# Patient Record
Sex: Male | Born: 1995 | Race: Black or African American | Hispanic: No | Marital: Single | Smoking: Never smoker
Health system: Southern US, Community
[De-identification: ages and names within clinical notes are randomized; demographics above are authoritative.]

## PROBLEM LIST (undated history)

## (undated) DIAGNOSIS — J45909 Unspecified asthma, uncomplicated: Secondary | ICD-10-CM

## (undated) HISTORY — DX: Unspecified asthma, uncomplicated: J45.909

---
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
## 2018-07-26 ENCOUNTER — Ambulatory Visit: Payer: BC Managed Care – PPO

## 2018-07-26 ENCOUNTER — Other Ambulatory Visit: Payer: Self-pay

## 2018-07-26 ENCOUNTER — Encounter: Payer: Self-pay | Admitting: Family Medicine

## 2018-07-26 ENCOUNTER — Ambulatory Visit (INDEPENDENT_AMBULATORY_CARE_PROVIDER_SITE_OTHER): Payer: BC Managed Care – PPO | Admitting: Family Medicine

## 2018-07-26 ENCOUNTER — Ambulatory Visit (INDEPENDENT_AMBULATORY_CARE_PROVIDER_SITE_OTHER): Payer: BC Managed Care – PPO

## 2018-07-26 VITALS — BP 116/69 | HR 56 | Temp 98.5°F | Resp 20 | Ht 68.0 in | Wt 204.0 lb

## 2018-07-26 DIAGNOSIS — R0789 Other chest pain: Secondary | ICD-10-CM | POA: Diagnosis not present

## 2018-07-26 DIAGNOSIS — R058 Other specified cough: Secondary | ICD-10-CM

## 2018-07-26 DIAGNOSIS — R05 Cough: Secondary | ICD-10-CM | POA: Diagnosis not present

## 2018-07-26 DIAGNOSIS — J208 Acute bronchitis due to other specified organisms: Secondary | ICD-10-CM | POA: Diagnosis not present

## 2018-07-26 DIAGNOSIS — R062 Wheezing: Secondary | ICD-10-CM

## 2018-07-26 MED ORDER — PREDNISONE 20 MG PO TABS: 40.0000 mg | ORAL_TABLET | Freq: Every day | ORAL | 0 refills | Status: AC

## 2018-07-26 MED ORDER — ALBUTEROL SULFATE HFA 108 (90 BASE) MCG/ACT IN AERS: 2.0000 | INHALATION_SPRAY | RESPIRATORY_TRACT | 1 refills | Status: DC | PRN

## 2018-07-26 MED ORDER — AMOXICILLIN-POT CLAVULANATE 875-125 MG PO TABS: 1.0000 | ORAL_TABLET | Freq: Two times a day (BID) | ORAL | 0 refills | Status: DC

## 2018-07-26 MED ORDER — BENZONATATE 100 MG PO CAPS: 100.0000 mg | ORAL_CAPSULE | Freq: Three times a day (TID) | ORAL | 0 refills | Status: DC | PRN

## 2018-07-26 NOTE — Patient Instructions (Signed)
You are being treated for bronchitis today.  I will start you on an antibiotic which is Augmentin you will take 1 tablet twice daily for a total of 10 days.  I am also prescribing you prednisone this is to open up your lungs and decrease the inflammation.  You will take 2 tablets daily with breakfast for only 5 days.  I prescribed you benzonatate you may take 1 to 2 tablets 3 times daily as needed for cough.  I have also prescribed you an albuterol inhaler you can take 2 puffs every 6 hours as needed for persistent cough and or shortness of breath and/or wheezing.  If symptoms have not improved within 5 to 7 days please follow-up by phone for further evaluation.    Acute Bronchitis, Adult Acute bronchitis is when air tubes (bronchi) in the lungs suddenly get swollen. The condition can make it hard to breathe. It can also cause these symptoms:  A cough.  Coughing up clear, yellow, or green mucus.  Wheezing.  Chest congestion.  Shortness of breath.  A fever.  Body aches.  Chills.  A sore throat. Follow these instructions at home:  Medicines  Take over-the-counter and prescription medicines only as told by your doctor.  If you were prescribed an antibiotic medicine, take it as told by your doctor. Do not stop taking the antibiotic even if you start to feel better. General instructions  Rest.  Drink enough fluids to keep your pee (urine) pale yellow.  Avoid smoking and secondhand smoke. If you smoke and you need help quitting, ask your doctor. Quitting will help your lungs heal faster.  Use an inhaler, cool mist vaporizer, or humidifier as told by your doctor.  Keep all follow-up visits as told by your doctor. This is important. How is this prevented? To lower your risk of getting this condition again:  Wash your hands often with soap and water. If you cannot use soap and water, use hand sanitizer.  Avoid contact with people who have cold symptoms.  Try not to touch  your hands to your mouth, nose, or eyes.  Make sure to get the flu shot every year. Contact a doctor if:  Your symptoms do not get better in 2 weeks. Get help right away if:  You cough up blood.  You have chest pain.  You have very bad shortness of breath.  You become dehydrated.  You faint (pass out) or keep feeling like you are going to pass out.  You keep throwing up (vomiting).  You have a very bad headache.  Your fever or chills gets worse. This information is not intended to replace advice given to you by your health care provider. Make sure you discuss any questions you have with your health care provider. Document Released: 10/12/2007 Document Revised: 12/07/2016 Document Reviewed: 10/14/2015 Elsevier Interactive Patient Education  2019 [employer].

## 2018-07-26 NOTE — Progress Notes (Deleted)
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

## 2018-07-26 NOTE — Progress Notes (Signed)
Blake Robles, is a 23 y.o. male  [id number]:[id number]  [id number]  DOB - [birth year]  CC: No chief complaint on file.      HPI: Blake Robles is a 23 y.o. male is here today to establish care and evaluation of chest congestion. Medical history significant for asthma and chronic seasonal allergies. Patient presents today, accompanied by his father, with a complaint of productive cough (green mucus), mild shortness of breath with activity. He is uncertain of fever as he has not measured temperature. He has a history of asthma, but denies wheezing. He has taken over the counter medication which has mildly improved cough. He recently returned home from college at [employer]. Symptoms have remained been present for 5-7 days.   Current medications: Current Outpatient Medications:  .  albuterol (PROVENTIL HFA;VENTOLIN HFA) 108 (90 Base) MCG/ACT inhaler, Inhale 2 puffs into the lungs every 4 (four) hours as needed for wheezing or shortness of breath (cough, shortness of breath or wheezing.)., Disp: 1 Inhaler, Rfl: 1 .  amoxicillin-clavulanate (AUGMENTIN) 875-125 MG tablet, Take 1 tablet by mouth 2 (two) times daily., Disp: 20 tablet, Rfl: 0 .  benzonatate (TESSALON) 100 MG capsule, Take 1-2 capsules (100-200 mg total) by mouth 3 (three) times daily as needed for cough., Disp: 40 capsule, Rfl: 0 .  predniSONE (DELTASONE) 20 MG tablet, Take 2 tablets (40 mg total) by mouth daily with breakfast for 5 days., Disp: 10 tablet, Rfl: 0   Pertinent family medical history: family history is not on file.   Not on File  Social History   Socioeconomic History  . Marital status: Single    Spouse name: Not on file  . Number of children: Not on file  . Years of education: Not on file  . Highest education level: Not on file  Occupational History  . Not on file  Social Needs  . Financial resource strain: Not on file  . Food insecurity:    Worry: Not on file    Inability: Not on file  . Transportation needs:   Medical: Not on file    Non-medical: Not on file  Tobacco Use  . Smoking status: Not on file  Substance and Sexual Activity  . Alcohol use: Not on file  . Drug use: Not on file  . Sexual activity: Not on file  Lifestyle  . Physical activity:    Days per week: Not on file    Minutes per session: Not on file  . Stress: Not on file  Relationships  . Social connections:    Talks on phone: Not on file    Gets together: Not on file    Attends religious service: Not on file    Active member of club or organization: Not on file    Attends meetings of clubs or organizations: Not on file    Relationship status: Not on file  . Intimate partner violence:    Fear of current or ex partner: Not on file    Emotionally abused: Not on file    Physically abused: Not on file    Forced sexual activity: Not on file  Other Topics Concern  . Not on file  Social History Narrative  . Not on file    Review of Systems: Pertinent negatives listed in HPI Objective:   Vitals:   07/26/18 1117  BP: 116/69  Pulse: (!) 56  Resp: 20  Temp: 98.5 F (36.9 C)  SpO2: 96%    BP Readings from Last 3 Encounters:  07/26/18  116/69    Filed Weights   07/26/18 1117  Weight: 204 lb (92.5 kg)      Physical Exam: General appearance: alert, well developed, well nourished, cooperative and in no distress Head: Normocephalic, without obvious abnormality, atraumatic Respiratory: Respirations even and unlabored, normal respiratory rate Heart: rate and rhythm normal. No gallop or murmurs noted on exam  Abdomen: BS +, no distention, no rebound tenderness, or no mass Extremities: No gross deformities Skin: Skin color, texture, turgor normal. No rashes seen  Psych: Appropriate mood and affect. Neurologic: Mental status: Alert, oriented to person, place, and time, thought content appropriate.     Assessment and plan:  1. Productive cough - DG Chest 2 View; Future 2. Acute bronchitis due to other specified  organisms -Start Amoxicillin 875 twice daily   -Start benzonatate 100-200 mg up to 3 times daily -Start prednisone 40 mg x 5 days -Start albuterol inhaler 2 puffs twice daily as needed   Dg Chest 2 View  Result Date: 07/26/2018 CLINICAL DATA:  Productive cough with wheezing for the past 7 days. Nasal and chest congestion. Recent travel. EXAM: CHEST - 2 VIEW COMPARISON:  None. FINDINGS: Normal sized heart. Clear lungs. Mild-to-moderate peribronchial thickening. Unremarkable bones. IMPRESSION: Mild to moderate bronchitic changes. Electronically Signed   By: Beckie Salts M.D.   On: 07/26/2018 12:24    Return for follow-up if symptoms worsen or do improve.  The patient was given clear instructions to go to ER or return to medical center if symptoms don't improve, worsen or new problems develop. The patient verbalized understanding. The patient was advised  to call and obtain lab results if they haven't heard anything from out office within 7-10 business days.  Joaquin Courts, FNP Primary Care at [facility] [address], [place] [address] [phone]: [phone]    This note has been created with Dragon speech recognition software and Paediatric nurse. Any transcriptional errors are unintentional.

## 2018-07-26 NOTE — Progress Notes (Deleted)
Blake Robles, is a 23 y.o. male  VOZ:[id number]  [id number]  DOB - Jul 26, 1995  CC: No chief complaint on file.      HPI: Blake Robles is a 23 y.o. male is here today to establish care.   Blake Robles. does not have a problem list on file.    Today's visit:    Patient denies new headaches, chest pain, abdominal pain, nausea, new weakness , numbness or tingling, SOB, edema, or worrisome cough. .    Current medications:No current outpatient medications on file.   Pertinent family medical history: family history is not on file.   Not on File  Social History   Socioeconomic History  . Marital status: Single    Spouse name: Not on file  . Number of children: Not on file  . Years of education: Not on file  . Highest education level: Not on file  Occupational History  . Not on file  Social Needs  . Financial resource strain: Not on file  . Food insecurity:    Worry: Not on file    Inability: Not on file  . Transportation needs:    Medical: Not on file    Non-medical: Not on file  Tobacco Use  . Smoking status: Not on file  Substance and Sexual Activity  . Alcohol use: Not on file  . Drug use: Not on file  . Sexual activity: Not on file  Lifestyle  . Physical activity:    Days per week: Not on file    Minutes per session: Not on file  . Stress: Not on file  Relationships  . Social connections:    Talks on phone: Not on file    Gets together: Not on file    Attends religious service: Not on file    Active member of club or organization: Not on file    Attends meetings of clubs or organizations: Not on file    Relationship status: Not on file  . Intimate partner violence:    Fear of current or ex partner: Not on file    Emotionally abused: Not on file    Physically abused: Not on file    Forced sexual activity: Not on file  Other Topics Concern  . Not on file  Social History Narrative  . Not on file    Review of Systems: Constitutional: Negative for  fever, chills, diaphoresis, activity change, appetite change and fatigue. HENT: Negative for ear pain, nosebleeds, congestion, facial swelling, rhinorrhea, neck pain, neck stiffness and ear discharge.  Eyes: Negative for pain, discharge, redness, itching and visual disturbance. Respiratory: Negative for cough, choking, chest tightness, shortness of breath, wheezing and stridor.  Cardiovascular: Negative for chest pain, palpitations and leg swelling. Gastrointestinal: Negative for abdominal distention. Genitourinary: Negative for dysuria, urgency, frequency, hematuria, flank pain, decreased urine volume, difficulty urinating. Musculoskeletal: Negative for back pain, joint swelling, arthralgia and gait problem. Neurological: Negative for dizziness, tremors, seizures, syncope, facial asymmetry, speech difficulty, weakness, light-headedness, numbness and headaches.  Hematological: Negative for adenopathy. Does not bruise/bleed easily. Psychiatric/Behavioral: Negative for hallucinations, behavioral problems, confusion, dysphoric mood, decreased concentration and agitation.    Objective:  There were no vitals filed for this visit.  BP Readings from Last 3 Encounters:  No data found for BP    There were no vitals filed for this visit.    Physical Exam: Constitutional: Patient appears well-developed and well-nourished. No distress. HENT: Normocephalic, atraumatic, External right and left ear normal. Oropharynx is clear and  moist.  Eyes: Conjunctivae and EOM are normal. PERRLA, no scleral icterus. Neck: Normal ROM. Neck supple. No JVD. No tracheal deviation. No thyromegaly. CVS: RRR, S1/S2 +, no murmurs, no gallops, no carotid bruit.  Pulmonary: Effort and breath sounds normal, no stridor, rhonchi, wheezes, rales.  Abdominal: Soft. BS +, no distension, tenderness, rebound or guarding.  Musculoskeletal: Normal range of motion. No edema and no tenderness.  Neuro: Alert. Normal muscle tone  coordination. Normal gait. BUE and BLE strength 5/5. Bilateral hand grips symmetrical. No cranial nerve deficit. Skin: Skin is warm and dry. No rash noted. Not diaphoretic. No erythema. No pallor. Psychiatric: Normal mood and affect. Behavior, judgment, thought content normal.  Lab Results (prior encounters)  No results found for: WBC, HGB, HCT, MCV, PLT No results found for: CREATININE, BUN, NA, K, CL, CO2  No results found for: HGBA1C  No results found for: CHOL, TRIG, HDL, CHOLHDL, VLDL, LDLCALC      Assessment and plan:  There are no diagnoses linked to this encounter.  No follow-ups on file.   The patient was given clear instructions to go to ER or return to medical center if symptoms don't improve, worsen or new problems develop. The patient verbalized understanding. The patient was advised  to call and obtain lab results if they haven't heard anything from out office within 7-10 business days.  Blake Courts, FNP Primary Care at [facility] [address], [place] [address] [phone]: [phone]    This note has been created with Dragon speech recognition software and Paediatric nurse. Any transcriptional errors are unintentional.

## 2019-08-17 IMAGING — DX CHEST - 2 VIEW
2 series · 2 of 2 positions shown · non-contrast
Comparison: None.

CLINICAL DATA: Productive cough with wheezing for the past 7 days.
Nasal and chest congestion. Recent travel.

EXAM:
CHEST - 2 VIEW

[chest pa]
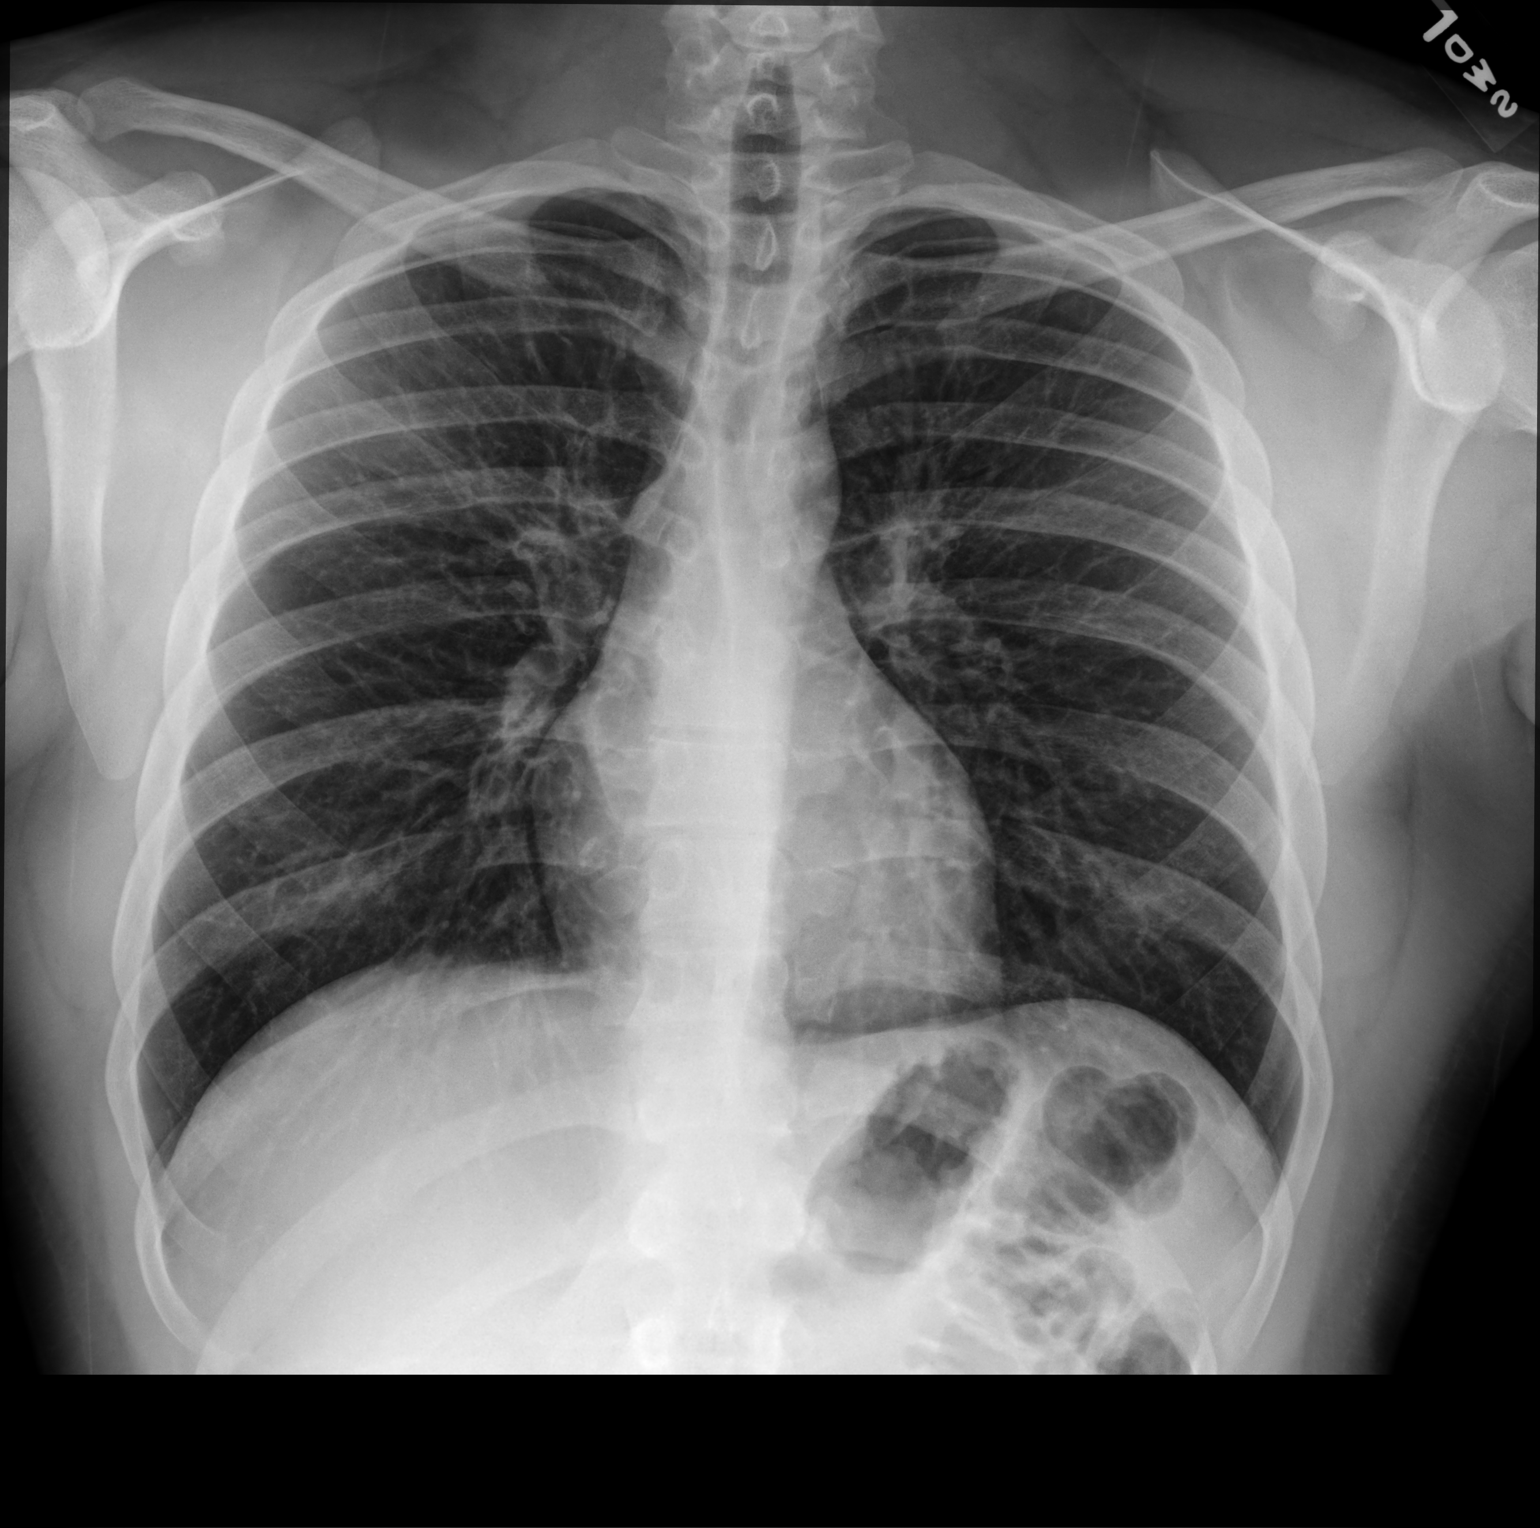

[chest lat]
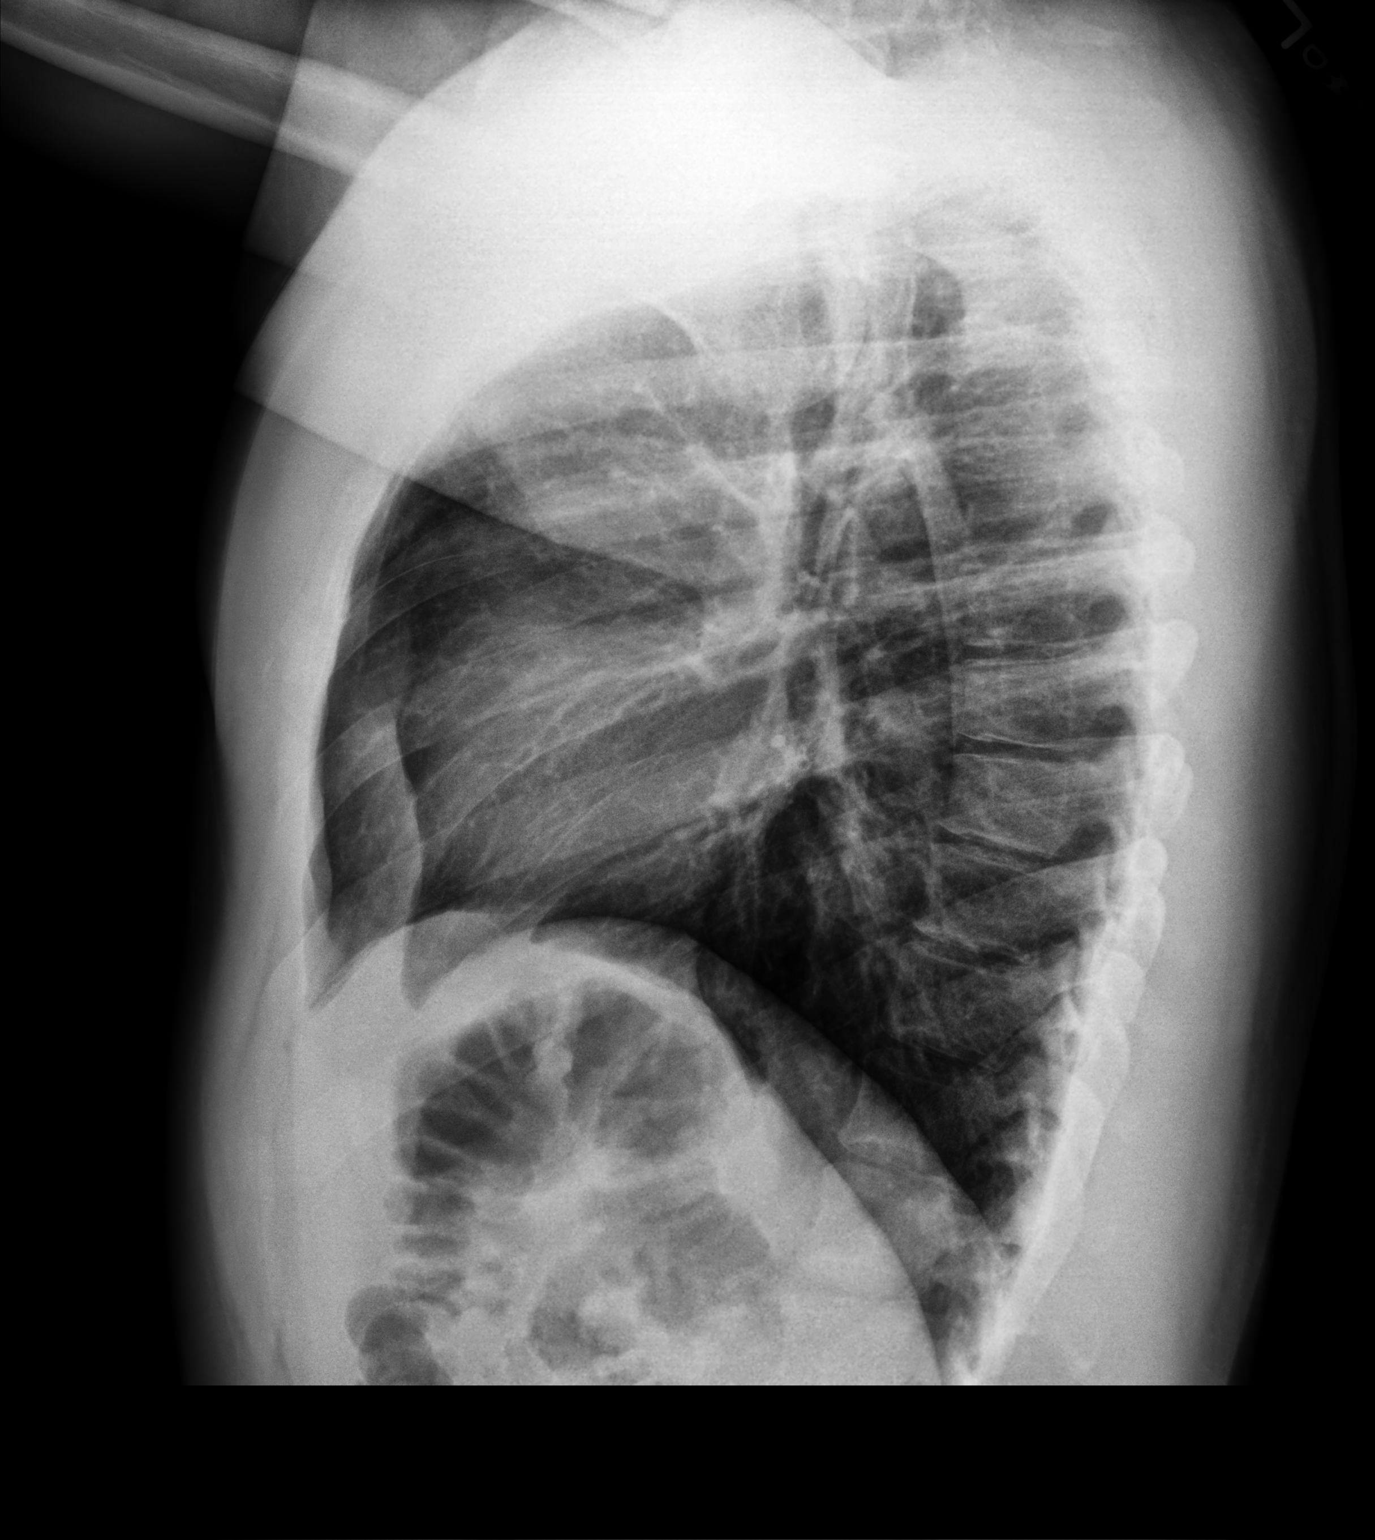

[2 of 2 positions shown; findings below may reference images not displayed]

FINDINGS: Normal sized heart. Clear lungs. Mild-to-moderate peribronchial
thickening. Unremarkable bones.
IMPRESSION: Mild to moderate bronchitic changes.

## 2022-10-02 ENCOUNTER — Ambulatory Visit
Admission: EM | Admit: 2022-10-02 | Discharge: 2022-10-02 | Disposition: A | Discharge status: Home / Self Care | Payer: 59 | Attending: Urgent Care | Admitting: Urgent Care

## 2022-10-02 ENCOUNTER — Ambulatory Visit (INDEPENDENT_AMBULATORY_CARE_PROVIDER_SITE_OTHER): Payer: 59

## 2022-10-02 DIAGNOSIS — S6991XA Unspecified injury of right wrist, hand and finger(s), initial encounter: Secondary | ICD-10-CM | POA: Diagnosis not present

## 2022-10-02 MED ORDER — DICLOFENAC SODIUM 75 MG PO TBEC: 75.0000 mg | DELAYED_RELEASE_TABLET | Freq: Two times a day (BID) | ORAL | 0 refills | Status: AC

## 2022-10-02 NOTE — ED Triage Notes (Signed)
Pt presents with finger pain (middle finger) he states that he popped it back in pain but the swelling will not go down.

## 2022-10-02 NOTE — ED Provider Notes (Signed)
[facility]    CSN: [id number] Arrival date & time: 10/02/22  1191      History   Chief Complaint Chief Complaint  Patient presents with   Hand Pain    HPI Blake Robles. is a 27 y.o. male.   Pleasant 27 year old male presents today due to concerns of pain in his right middle finger.  He states 1 week ago on Monday he was doing a workout, and tripped and fell.  He landed on his right hand, dislocating his middle finger.  He states that at the PIP joint his finger was bent at almost a 90 degree angle.  He yanked on his finger, and states he was able to pop it back into place.  He did buy a splint at [facility] and has been using it over the past week, but notes that the pain and swelling has not improved.  He reports pain to the PIP joint only, denies any pain to the middle or proximal phalanx.  He denies any symptoms in his hand.  He reports full range of motion to the digit.  He also reports full sensation.  He has not tried NSAIDs or ice.   Hand Pain    Past Medical History:  Diagnosis Date   Asthma    as child    There are no problems to display for this patient.   History reviewed. No pertinent surgical history.     Home Medications    Prior to Admission medications   Medication Sig Start Date End Date Taking? Authorizing Provider  diclofenac (VOLTAREN) 75 MG EC tablet Take 1 tablet (75 mg total) by mouth 2 (two) times daily with a meal for 7 days. 10/02/22 10/09/22 Yes Rindy Kollman L, PA    Family History Family History  Problem Relation Age of Onset   Hypertension Maternal Grandmother    Varicose Veins Maternal Grandmother    Hypertension Maternal Grandfather    Varicose Veins Maternal Grandfather    Arthritis Paternal Grandfather    Asthma Paternal Grandfather    Diabetes Paternal Grandfather    Heart disease Paternal Grandfather    Hypertension Paternal Grandfather    Varicose Veins Paternal Grandfather     Social History Social History    Tobacco Use   Smoking status: Never   Smokeless tobacco: Never  Vaping Use   Vaping Use: Never used  Substance Use Topics   Alcohol use: Yes    Comment: once per month   Drug use: Never     Allergies   Patient has no allergy information on record.   Review of Systems Review of Systems As per HPI  Physical Exam Triage Vital Signs ED Triage Vitals  Enc Vitals Group     BP 10/02/22 0953 125/85     Pulse Rate 10/02/22 0953 (!) 53     Resp 10/02/22 0953 18     Temp 10/02/22 0953 98.3 F (36.8 C)     Temp src --      SpO2 10/02/22 0953 96 %     Weight --      Height --      Head Circumference --      Peak Flow --      Pain Score 10/02/22 0952 6     Pain Loc --      Pain Edu? --      Excl. in GC? --    No data found.  Updated Vital Signs BP 125/85  Pulse (!) 53   Temp 98.3 F (36.8 C)   Resp 18   SpO2 96%   Visual Acuity Right Eye Distance:   Left Eye Distance:   Bilateral Distance:    Right Eye Near:   Left Eye Near:    Bilateral Near:     Physical Exam Vitals and nursing note reviewed. Exam conducted with a chaperone present.  Constitutional:      General: He is not in acute distress.    Appearance: Normal appearance. He is not ill-appearing, toxic-appearing or diaphoretic.  HENT:     Head: Normocephalic and atraumatic.  Cardiovascular:     Rate and Rhythm: Normal rate.  Pulmonary:     Effort: Pulmonary effort is normal. No respiratory distress.  Musculoskeletal:        General: Swelling (R middle finger PIP joint only) and tenderness (PIP joint only, no pain to palpation of middle or proximal phalanx. Hand not involved) present. No deformity. Normal range of motion.     Right lower leg: No edema.     Left lower leg: No edema.  Skin:    General: Skin is warm and dry.     Capillary Refill: Capillary refill takes less than 2 seconds.     Coloration: Skin is not jaundiced.     Findings: No bruising, erythema or rash.  Neurological:      General: No focal deficit present.     Mental Status: He is alert and oriented to person, place, and time.     Sensory: No sensory deficit.     Motor: No weakness.     Gait: Gait normal.  Psychiatric:        Mood and Affect: Mood normal.        Behavior: Behavior normal.      UC Treatments / Results  Labs (all labs ordered are listed, but only abnormal results are displayed) Labs Reviewed - No data to display  EKG   Radiology DG Finger Middle Right  Result Date: 10/02/2022 CLINICAL DATA:  Status post dislocation right middle finger PIP joint 6 days ago. Continued pain and swelling. EXAM: RIGHT MIDDLE FINGER 2+V COMPARISON:  None Available. FINDINGS: There is no evidence of fracture or dislocation. There is no evidence of arthropathy or other focal bone abnormality. Soft tissue edema noted. IMPRESSION: Soft tissue edema.  No fracture or dislocation. Electronically Signed   By: Signa Kell M.D.   On: 10/02/2022 10:22    Procedures Procedures (including critical care time)  Medications Ordered in UC Medications - No data to display  Initial Impression / Assessment and Plan / UC Course  I have reviewed the triage vital signs and the nursing notes.  Pertinent labs & imaging results that were available during my care of the patient were reviewed by me and considered in my medical decision making (see chart for details).     Joint injury to PIP joint R hand - xray negative for fracture.  Swelling likely secondary to injury of the joint capsule.  Patient has full range of motion, do not suspect any tendinous involvement.  No avulsion fracture noted.  Will recommend diclofenac twice daily with food x 7 days, ice the area.  Patient to follow-up with sports medicine should symptoms persist   Final Clinical Impressions(s) / UC Diagnoses   Final diagnoses:  Injury of joint of finger of right hand, initial encounter     Discharge Instructions      Your x-ray does not show  any  evidence of fracture or dislocation currently. I suspect your pain and swelling is due to an injury to the joint capsule. Please ice your finger 3 times daily for the next 3 to 5 days. Please start taking the anti-inflammatory medication called in twice daily with food.  Do not take any additional over-the-counter anti-inflammatory such as Motrin, Aleve, Advil, ibuprofen. You do not need to continue wearing the splint, range of motion activities are encouraged. Follow up with sports medicine if your symptoms persist >1 week.     ED Prescriptions     Medication Sig Dispense Auth. Provider   diclofenac (VOLTAREN) 75 MG EC tablet Take 1 tablet (75 mg total) by mouth 2 (two) times daily with a meal for 7 days. 14 tablet Timothee Gali L, [place]      PDMP not reviewed this encounter.   Maretta Bees, [place] 10/02/22 1029

## 2022-10-02 NOTE — Discharge Instructions (Addendum)
Your x-ray does not show any evidence of fracture or dislocation currently. I suspect your pain and swelling is due to an injury to the joint capsule. Please ice your finger 3 times daily for the next 3 to 5 days. Please start taking the anti-inflammatory medication called in twice daily with food.  Do not take any additional over-the-counter anti-inflammatory such as Motrin, Aleve, Advil, ibuprofen. You do not need to continue wearing the splint, range of motion activities are encouraged. Follow up with sports medicine if your symptoms persist >1 week.
